# Patient Record
Sex: Female | Born: 1990 | Race: Black or African American | Hispanic: No | Marital: Single | Smoking: Former smoker
Health system: Southern US, Community
[De-identification: ages and names within clinical notes are randomized; demographics above are authoritative.]

---
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
## 2013-11-23 ENCOUNTER — Encounter (HOSPITAL_COMMUNITY): Payer: Self-pay | Admitting: Emergency Medicine

## 2013-11-23 ENCOUNTER — Emergency Department (HOSPITAL_COMMUNITY)
Admission: EM | Admit: 2013-11-23 | Discharge: 2013-11-23 | Disposition: A | Discharge status: Home / Self Care | Payer: Medicaid Other | Attending: Emergency Medicine | Admitting: Emergency Medicine

## 2013-11-23 DIAGNOSIS — R112 Nausea with vomiting, unspecified: Secondary | ICD-10-CM | POA: Insufficient documentation

## 2013-11-23 DIAGNOSIS — Z87891 Personal history of nicotine dependence: Secondary | ICD-10-CM | POA: Insufficient documentation

## 2013-11-23 DIAGNOSIS — R197 Diarrhea, unspecified: Secondary | ICD-10-CM | POA: Insufficient documentation

## 2013-11-23 DIAGNOSIS — Z3202 Encounter for pregnancy test, result negative: Secondary | ICD-10-CM | POA: Insufficient documentation

## 2013-11-23 LAB — CBC WITH DIFFERENTIAL/PLATELET
Basophils Absolute: 0 10*3/uL (ref 0.0–0.1)
Basophils Relative: 0 % (ref 0–1)
Eosinophils Absolute: 0.1 10*3/uL (ref 0.0–0.7)
Eosinophils Relative: 1 % (ref 0–5)
Lymphs Abs: 1.8 10*3/uL (ref 0.7–4.0)
MCH: 30.9 pg (ref 26.0–34.0)
MCHC: 34.6 g/dL (ref 30.0–36.0)
MCV: 89.4 fL (ref 78.0–100.0)
Neutrophils Relative %: 53 % (ref 43–77)
Platelets: 157 10*3/uL (ref 150–400)
RDW: 12.4 % (ref 11.5–15.5)

## 2013-11-23 LAB — URINE MICROSCOPIC-ADD ON

## 2013-11-23 LAB — COMPREHENSIVE METABOLIC PANEL
ALT: 11 U/L (ref 0–35)
Albumin: 4 g/dL (ref 3.5–5.2)
Alkaline Phosphatase: 57 U/L (ref 39–117)
BUN: 16 mg/dL (ref 6–23)
Calcium: 9 mg/dL (ref 8.4–10.5)
Creatinine, Ser: 0.82 mg/dL (ref 0.50–1.10)
GFR calc Af Amer: >90 mL/min (ref >90)
Glucose, Bld: 92 mg/dL (ref 70–99)
Potassium: 3.7 mEq/L (ref 3.5–5.1)
Sodium: 139 mEq/L (ref 135–145)
Total Protein: 7.3 g/dL (ref 6.0–8.3)

## 2013-11-23 LAB — URINALYSIS, ROUTINE W REFLEX MICROSCOPIC
Leukocytes, UA: NEGATIVE
Nitrite: NEGATIVE
Protein, ur: 30 mg/dL — AB
Specific Gravity, Urine: 1.041 — ABNORMAL HIGH (ref 1.005–1.030)
Urobilinogen, UA: 1 mg/dL (ref 0.0–1.0)
pH: 6.5 (ref 5.0–8.0)

## 2013-11-23 LAB — POCT PREGNANCY, URINE: Preg Test, Ur: NEGATIVE

## 2013-11-23 LAB — LIPASE, BLOOD: Lipase: 23 U/L (ref 11–59)

## 2013-11-23 MED ORDER — ONDANSETRON HCL 4 MG PO TABS: 4.0000 mg | ORAL_TABLET | Freq: Four times a day (QID) | ORAL | Status: DC

## 2013-11-23 MED ORDER — ONDANSETRON 4 MG PO TBDP
4.0000 mg | ORAL_TABLET | Freq: Once | ORAL | Status: AC
  Administered 2013-11-23: 4 mg via ORAL
  Filled 2013-11-23: qty 1

## 2013-11-23 NOTE — ED Notes (Signed)
Pt presents with vomiting and diarrhea for the past 2 days- denies any pain anywhere, pt has no complaints at this time.  Pt drank gingerale in the lobby and does not feel nauseas at this time.

## 2013-11-23 NOTE — ED Notes (Signed)
Pt c/o N/V/D after eating only x 2 days; pt denies pain; pt sts LMP was 11/05/13

## 2013-11-23 NOTE — ED Provider Notes (Signed)
CSN: [id number]     Arrival date & time 11/23/13  1640 History   First MD Initiated Contact with Patient 11/23/13 1819     Chief Complaint  Patient presents with  . Emesis  . Diarrhea   (Consider location/radiation/quality/duration/timing/severity/associated sxs/prior Treatment) Patient is a 22 y.o. female presenting with vomiting and diarrhea.  Emesis Severity:  Moderate Duration:  2 days Timing:  Constant Quality:  Stomach contents Able to tolerate:  Liquids Progression:  Improving Chronicity:  New Relieved by:  Nothing Worsened by:  Nothing tried Associated symptoms: diarrhea   Associated symptoms: no abdominal pain and no fever   Diarrhea:    Quality:  Watery   Number of occurrences:  3 today Diarrhea Associated symptoms: vomiting   Associated symptoms: no abdominal pain and no fever     History reviewed. No pertinent past medical history. History reviewed. No pertinent past surgical history. History reviewed. No pertinent family history. History  Substance Use Topics  . Smoking status: Former Games developer  . Smokeless tobacco: Not on file  . Alcohol Use: No   OB History   Grav Para Term Preterm Abortions TAB SAB Ect Mult Living                 Review of Systems  Constitutional: Negative for fever.  HENT: Negative for congestion.   Respiratory: Negative for cough and shortness of breath.   Cardiovascular: Negative for chest pain.  Gastrointestinal: Positive for vomiting and diarrhea. Negative for nausea and abdominal pain.  All other systems reviewed and are negative.    Allergies  Review of patient's allergies indicates no known allergies.  Home Medications  No current outpatient prescriptions on file. BP 101/69  Pulse 85  Temp(Src) 98.3 F (36.8 C) (Oral)  Resp 18  Wt 114 lb 7 oz (51.909 kg)  SpO2 100% Physical Exam  Nursing note and vitals reviewed. Constitutional: She is oriented to person, place, and time. She appears well-developed and  well-nourished. No distress.  HENT:  Head: Normocephalic and atraumatic.  Mouth/Throat: Oropharynx is clear and moist.  Eyes: Conjunctivae are normal. Pupils are equal, round, and reactive to light. No scleral icterus.  Neck: Neck supple.  Cardiovascular: Normal rate, regular rhythm, normal heart sounds and intact distal pulses.   No murmur heard. Pulmonary/Chest: Effort normal and breath sounds normal. No stridor. No respiratory distress. She has no rales.  Abdominal: Soft. Bowel sounds are normal. She exhibits no distension. There is no tenderness.  Musculoskeletal: Normal range of motion.  Neurological: She is alert and oriented to person, place, and time.  Skin: Skin is warm and dry. No rash noted.  Psychiatric: She has a normal mood and affect. Her behavior is normal.    ED Course  Procedures (including critical care time) Labs Review Labs Reviewed  URINALYSIS, ROUTINE W REFLEX MICROSCOPIC - Abnormal; Notable for the following:    APPearance CLOUDY (*)    Specific Gravity, Urine 1.041 (*)    Bilirubin Urine SMALL (*)    Ketones, ur 15 (*)    Protein, ur 30 (*)    All other components within normal limits  URINE MICROSCOPIC-ADD ON - Abnormal; Notable for the following:    Squamous Epithelial / LPF MANY (*)    Bacteria, UA FEW (*)    All other components within normal limits  CBC WITH DIFFERENTIAL  COMPREHENSIVE METABOLIC PANEL  LIPASE, BLOOD  POCT PREGNANCY, URINE   Imaging Review No results found.  EKG Interpretation   None  MDM   1. Nausea vomiting and diarrhea    Well appearing 22 yo female with vomiting yesterday and diarrhea and persistent nausea today.  She is nontoxic, not distressed, does not appear dehydrated.  Abdominal exam completely benign.  Will try zofran.  Advised plenty of fluids (which she is tolerating).      Candyce Churn, MD 11/23/13 [phone]

## 2014-08-26 ENCOUNTER — Emergency Department (HOSPITAL_COMMUNITY)
Admission: EM | Admit: 2014-08-26 | Discharge: 2014-08-26 | Disposition: A | Discharge status: Home / Self Care | Payer: Medicaid Other | Attending: Emergency Medicine | Admitting: Emergency Medicine

## 2014-08-26 DIAGNOSIS — R404 Transient alteration of awareness: Secondary | ICD-10-CM | POA: Diagnosis present

## 2014-08-26 DIAGNOSIS — R29898 Other symptoms and signs involving the musculoskeletal system: Secondary | ICD-10-CM | POA: Insufficient documentation

## 2014-08-26 DIAGNOSIS — Z79899 Other long term (current) drug therapy: Secondary | ICD-10-CM | POA: Insufficient documentation

## 2014-08-26 DIAGNOSIS — Z3202 Encounter for pregnancy test, result negative: Secondary | ICD-10-CM | POA: Diagnosis not present

## 2014-08-26 DIAGNOSIS — Z87891 Personal history of nicotine dependence: Secondary | ICD-10-CM | POA: Insufficient documentation

## 2014-08-26 DIAGNOSIS — F410 Panic disorder [episodic paroxysmal anxiety] without agoraphobia: Secondary | ICD-10-CM | POA: Diagnosis not present

## 2014-08-26 LAB — I-STAT CHEM 8, ED
BUN: 13 mg/dL (ref 6–23)
CHLORIDE: 105 meq/L (ref 96–112)
Calcium, Ion: 1.23 mmol/L (ref 1.12–1.23)
Creatinine, Ser: 0.9 mg/dL (ref 0.50–1.10)
Glucose, Bld: 75 mg/dL (ref 70–99)
HCT: 40 % (ref 36.0–46.0)
Hemoglobin: 13.6 g/dL (ref 12.0–15.0)
POTASSIUM: 3.5 meq/L — AB (ref 3.7–5.3)
SODIUM: 142 meq/L (ref 137–147)
TCO2: 23 mmol/L (ref 0–100)

## 2014-08-26 LAB — POC URINE PREG, ED: Preg Test, Ur: NEGATIVE

## 2014-08-26 MED ORDER — DIAZEPAM 5 MG PO TABS
5.0000 mg | ORAL_TABLET | Freq: Once | ORAL | Status: AC
  Administered 2014-08-26: 5 mg via ORAL
  Filled 2014-08-26: qty 1

## 2014-08-26 NOTE — Discharge Instructions (Signed)

## 2014-08-26 NOTE — ED Notes (Signed)
Per EMS: Pt was reported to have hyperventilated until she had a syncopal episode. Pt states the panic attack began at 1530 and had numbness and cramping of her hands and feet. Pt reports that this started because she broke up with her boyfriend. Pt states this happened one month ago, however has not seen a doctor for that problem. Pt states that this episode was worse than last month.

## 2014-08-26 NOTE — Progress Notes (Signed)
  CARE MANAGEMENT ED NOTE 08/26/2014  Patient:  Vanessa Mann, Vanessa Mann   Account Number:  [id number]  Date Initiated:  08/26/2014  Documentation initiated by:  Radford Pax  Subjective/Objective Assessment:   Patient presents to Ed wth panic attack     Subjective/Objective Assessment Detail:     Action/Plan:   Action/Plan Detail:   Anticipated DC Date:  08/26/2014     Status Recommendation to Physician:   Result of Recommendation:    Other ED Services  Consult Working Plan    DC Planning Services  Other  PCP issues    Choice offered to / List presented to:            Status of service:  Completed, signed off  ED Comments:   ED Comments Detail:  EDCM spoke to patient at bedside.  Patient reports she does not have a pcp.  [facility] asked patient if there was a doctor printed on her Medicaid card?  Patient presented [facility] with Medicaid card.  Pcp listed on patient's Medicaid card is [facility].  [facility] informed patient that this was her pcp and encouraged patient to make a follow up appointment.  Patient verbalized understanding. System updated.

## 2014-08-26 NOTE — ED Provider Notes (Signed)
CSN: [id number]     Arrival date & time 08/26/14  1700 History   First MD Initiated Contact with Patient 08/26/14 1744     Chief Complaint  Patient presents with  . Anxiety  . Loss of Consciousness     (Consider location/radiation/quality/duration/timing/severity/associated sxs/prior Treatment) Patient is a 23 y.o. female presenting with anxiety and syncope. The history is provided by the patient. No language interpreter was used.  Anxiety This is a recurrent problem. The current episode started today. The problem occurs intermittently. The problem has been resolved. Associated symptoms comments: Hyperventilation, hand cramping. The symptoms are aggravated by stress.  Loss of Consciousness Associated symptoms: anxiety     No past medical history on file. No past surgical history on file. No family history on file. History  Substance Use Topics  . Smoking status: Former Games developer  . Smokeless tobacco: Not on file  . Alcohol Use: No   OB History   Grav Para Term Preterm Abortions TAB SAB Ect Mult Living                 Review of Systems  Cardiovascular: Positive for syncope.  Psychiatric/Behavioral: The patient is nervous/anxious.   All other systems reviewed and are negative.     Allergies  Review of patient's allergies indicates no known allergies.  Home Medications   Prior to Admission medications   Medication Sig Start Date End Date Taking? Authorizing Provider  fexofenadine (ALLEGRA) 180 MG tablet Take 180 mg by mouth daily.   Yes Historical Provider, MD  Prenatal Vit-Fe Fumarate-FA (PRENATAL MULTIVITAMIN) TABS tablet Take 1 tablet by mouth daily at 12 noon.   Yes Historical Provider, MD   BP 140/72  Pulse 76  Temp(Src) 98.2 F (36.8 C) (Oral)  Resp 18  SpO2 99% Physical Exam  Nursing note and vitals reviewed. Constitutional: She is oriented to person, place, and time. She appears well-developed and well-nourished. No distress.  HENT:  Head: Normocephalic.   Eyes: Pupils are equal, round, and reactive to light.  Neck: Neck supple.  Cardiovascular: Normal rate and regular rhythm.   Pulmonary/Chest: Effort normal and breath sounds normal.  Abdominal: Soft. Bowel sounds are normal.  Musculoskeletal: Normal range of motion. She exhibits no edema and no tenderness.  Hand cramping.  Lymphadenopathy:    She has no cervical adenopathy.  Neurological: She is alert and oriented to person, place, and time.  Skin: Skin is warm and dry.  Psychiatric: She has a normal mood and affect.    ED Course  Procedures (including critical care time) Labs Review Labs Reviewed  I-STAT CHEM 8, ED - Abnormal; Notable for the following:    Potassium 3.5 (*)    All other components within normal limits  POC URINE PREG, ED    Imaging Review No results found.   EKG Interpretation None     Patient with episode of hyperventilation followed by a brief syncopal episode.  Symptoms have mostly resolved with the exception of mild right hand cramping.  Not dyspneic or tachycardic. No suspicion for PE.  Labs reviewed, results shared with patient.  Hand cramping resolved during stay in ED. MDM   Final diagnoses:  None    Anxiety. Hyperventilation.    Jimmye Norman, NP 08/26/14 [phone]
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
## 2014-08-26 NOTE — ED Notes (Signed)
Pt requesting muscle relaxer for generalized muscle "tightness." NP aware.

## 2014-08-27 NOTE — ED Provider Notes (Signed)
Medical screening examination/treatment/procedure(s) were performed by non-physician practitioner and as supervising physician I was immediately available for consultation/collaboration.   EKG Interpretation None       Rusty Glodowski, MD 08/27/14 1537 

## 2015-01-25 ENCOUNTER — Emergency Department (HOSPITAL_COMMUNITY): Payer: Medicaid Other

## 2015-01-25 ENCOUNTER — Emergency Department (HOSPITAL_COMMUNITY)
Admission: EM | Admit: 2015-01-25 | Discharge: 2015-01-25 | Disposition: A | Discharge status: Home / Self Care | Payer: Medicaid Other | Attending: Emergency Medicine | Admitting: Emergency Medicine

## 2015-01-25 ENCOUNTER — Encounter (HOSPITAL_COMMUNITY): Payer: Self-pay | Admitting: *Deleted

## 2015-01-25 DIAGNOSIS — Y9389 Activity, other specified: Secondary | ICD-10-CM | POA: Diagnosis not present

## 2015-01-25 DIAGNOSIS — Z3202 Encounter for pregnancy test, result negative: Secondary | ICD-10-CM | POA: Diagnosis not present

## 2015-01-25 DIAGNOSIS — Y998 Other external cause status: Secondary | ICD-10-CM | POA: Insufficient documentation

## 2015-01-25 DIAGNOSIS — Z79899 Other long term (current) drug therapy: Secondary | ICD-10-CM | POA: Diagnosis not present

## 2015-01-25 DIAGNOSIS — S060X9A Concussion with loss of consciousness of unspecified duration, initial encounter: Secondary | ICD-10-CM

## 2015-01-25 DIAGNOSIS — S199XXA Unspecified injury of neck, initial encounter: Secondary | ICD-10-CM | POA: Diagnosis not present

## 2015-01-25 DIAGNOSIS — Z87891 Personal history of nicotine dependence: Secondary | ICD-10-CM | POA: Diagnosis not present

## 2015-01-25 DIAGNOSIS — S3991XA Unspecified injury of abdomen, initial encounter: Secondary | ICD-10-CM | POA: Insufficient documentation

## 2015-01-25 DIAGNOSIS — S299XXA Unspecified injury of thorax, initial encounter: Secondary | ICD-10-CM | POA: Insufficient documentation

## 2015-01-25 DIAGNOSIS — S0990XA Unspecified injury of head, initial encounter: Secondary | ICD-10-CM | POA: Diagnosis present

## 2015-01-25 DIAGNOSIS — Y92410 Unspecified street and highway as the place of occurrence of the external cause: Secondary | ICD-10-CM | POA: Diagnosis not present

## 2015-01-25 DIAGNOSIS — S060X1A Concussion with loss of consciousness of 30 minutes or less, initial encounter: Secondary | ICD-10-CM

## 2015-01-25 LAB — BASIC METABOLIC PANEL
Anion gap: 6 (ref 5–15)
BUN: 12 mg/dL (ref 6–23)
CALCIUM: 9.3 mg/dL (ref 8.4–10.5)
CO2: 25 mmol/L (ref 19–32)
Chloride: 108 mmol/L (ref 96–112)
Creatinine, Ser: 0.87 mg/dL (ref 0.50–1.10)
Glucose, Bld: 86 mg/dL (ref 70–99)
Potassium: 3.9 mmol/L (ref 3.5–5.1)
Sodium: 139 mmol/L (ref 135–145)

## 2015-01-25 LAB — CBC WITH DIFFERENTIAL/PLATELET
Basophils Absolute: 0 10*3/uL (ref 0.0–0.1)
Basophils Relative: 0 % (ref 0–1)
Eosinophils Absolute: 0.1 10*3/uL (ref 0.0–0.7)
Eosinophils Relative: 3 % (ref 0–5)
HCT: 36.2 % (ref 36.0–46.0)
Hemoglobin: 12.1 g/dL (ref 12.0–15.0)
LYMPHS ABS: 1.7 10*3/uL (ref 0.7–4.0)
Lymphocytes Relative: 33 % (ref 12–46)
MCH: 29.4 pg (ref 26.0–34.0)
MCHC: 33.4 g/dL (ref 30.0–36.0)
MCV: 87.9 fL (ref 78.0–100.0)
MONOS PCT: 9 % (ref 3–12)
Monocytes Absolute: 0.5 10*3/uL (ref 0.1–1.0)
NEUTROS ABS: 2.8 10*3/uL (ref 1.7–7.7)
Neutrophils Relative %: 55 % (ref 43–77)
Platelets: 187 10*3/uL (ref 150–400)
RBC: 4.12 MIL/uL (ref 3.87–5.11)
RDW: 12.2 % (ref 11.5–15.5)
WBC: 5.1 10*3/uL (ref 4.0–10.5)

## 2015-01-25 LAB — I-STAT BETA HCG BLOOD, ED (MC, WL, AP ONLY): I-stat hCG, quantitative: <5 m[IU]/mL (ref <5)

## 2015-01-25 LAB — I-STAT CHEM 8, ED
BUN: 13 mg/dL (ref 6–23)
CALCIUM ION: 1.26 mmol/L — AB (ref 1.12–1.23)
CHLORIDE: 106 mmol/L (ref 96–112)
Creatinine, Ser: 0.9 mg/dL (ref 0.50–1.10)
Glucose, Bld: 82 mg/dL (ref 70–99)
HCT: 42 % (ref 36.0–46.0)
Hemoglobin: 14.3 g/dL (ref 12.0–15.0)
POTASSIUM: 3.9 mmol/L (ref 3.5–5.1)
Sodium: 141 mmol/L (ref 135–145)
TCO2: 21 mmol/L (ref 0–100)

## 2015-01-25 MED ORDER — IOHEXOL 300 MG/ML  SOLN
100.0000 mL | Freq: Once | INTRAMUSCULAR | Status: AC | PRN
  Administered 2015-01-25: 70 mL via INTRAVENOUS

## 2015-01-25 MED ORDER — KETOROLAC TROMETHAMINE 30 MG/ML IJ SOLN
30.0000 mg | Freq: Once | INTRAMUSCULAR | Status: AC
  Administered 2015-01-25: 30 mg via INTRAVENOUS
  Filled 2015-01-25: qty 1

## 2015-01-25 MED ORDER — NAPROXEN 500 MG PO TABS: 500.0000 mg | ORAL_TABLET | Freq: Two times a day (BID) | ORAL | Status: DC

## 2015-01-25 NOTE — ED Notes (Signed)
Pt is in stable condition upon d/c and is escorted by ED staff via wheelchair from ED.
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
## 2015-01-25 NOTE — ED Notes (Signed)
Pt returned from radiology.
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
## 2016-06-18 ENCOUNTER — Encounter (HOSPITAL_COMMUNITY): Payer: Self-pay | Admitting: Emergency Medicine

## 2016-06-18 ENCOUNTER — Emergency Department (HOSPITAL_COMMUNITY)
Admission: EM | Admit: 2016-06-18 | Discharge: 2016-06-18 | Disposition: A | Discharge status: Home / Self Care | Payer: No Typology Code available for payment source | Attending: Emergency Medicine | Admitting: Emergency Medicine

## 2016-06-18 DIAGNOSIS — K0889 Other specified disorders of teeth and supporting structures: Secondary | ICD-10-CM | POA: Insufficient documentation

## 2016-06-18 DIAGNOSIS — Z792 Long term (current) use of antibiotics: Secondary | ICD-10-CM | POA: Insufficient documentation

## 2016-06-18 DIAGNOSIS — F410 Panic disorder [episodic paroxysmal anxiety] without agoraphobia: Secondary | ICD-10-CM | POA: Insufficient documentation

## 2016-06-18 DIAGNOSIS — Z87891 Personal history of nicotine dependence: Secondary | ICD-10-CM | POA: Insufficient documentation

## 2016-06-18 DIAGNOSIS — Z791 Long term (current) use of non-steroidal anti-inflammatories (NSAID): Secondary | ICD-10-CM | POA: Insufficient documentation

## 2016-06-18 MED ORDER — PENICILLIN V POTASSIUM 500 MG PO TABS: 500.0000 mg | ORAL_TABLET | Freq: Four times a day (QID) | ORAL | Status: AC

## 2016-06-18 MED ORDER — NAPROXEN 500 MG PO TABS
500.0000 mg | ORAL_TABLET | Freq: Two times a day (BID) | ORAL | Status: AC
Start: 2016-06-18 — End: ?

## 2016-06-18 MED ORDER — ONDANSETRON 4 MG PO TBDP
4.0000 mg | ORAL_TABLET | Freq: Once | ORAL | Status: AC
  Administered 2016-06-18: 4 mg via ORAL
  Filled 2016-06-18: qty 1

## 2016-06-18 MED ORDER — GI COCKTAIL ~~LOC~~
30.0000 mL | Freq: Once | ORAL | Status: AC
Start: 2016-06-18 — End: 2016-06-18
  Administered 2016-06-18: 30 mL via ORAL
  Filled 2016-06-18: qty 30

## 2016-06-18 NOTE — ED Notes (Signed)
"  My toothache is triggering my panic attack". C/o right dental pain, onset yesterday. Pt appears very anxious.

## 2016-06-18 NOTE — ED Provider Notes (Signed)
CSN: [id number]     Arrival date & time 06/18/16  0909 History  By signing my name below, I, Vanessa Mann, attest that this documentation has been prepared under the direction and in the presence of non-physician practitioner, Everlene Farrier, PA-C. Electronically Signed: Freida Mann, Scribe. 06/18/2016. 9:22 AM.    Chief Complaint  Patient presents with  . Dental Pain   The history is provided by the patient. No language interpreter was used.    HPI Comments:  Vanessa Mann is a 25 y.o. female who presents to the Emergency Department complaining of 10/10, constant, upper right dental, pain since yesterday afternoon. Pt states she tried to get in with her dentist but they were booked. She has been taking ibuprofen with little relief. Pt reports associated nausea. She denies fever, sore throat, trouble swallowing, discharge with from the tooth or from her ears, ear pain, neck pain, vomiting, and cough.    History reviewed. No pertinent past medical history. Past Surgical History  Procedure Laterality Date  . Cesarean section     No family history on file., Social History  Substance Use Topics  . Smoking status: Former Games developer  . Smokeless tobacco: None  . Alcohol Use: No   OB History    No data available     Review of Systems  Constitutional: Negative for fever.  HENT: Positive for dental problem. Negative for ear discharge, ear pain, sore throat and trouble swallowing.   Eyes: Negative for visual disturbance.  Respiratory: Negative for cough and shortness of breath.   Gastrointestinal: Positive for nausea. Negative for vomiting, abdominal pain and diarrhea.  Musculoskeletal: Negative for neck pain and neck stiffness.  Skin: Negative for rash.  All other systems reviewed and are negative.   Allergies  Review of patient's allergies indicates no known allergies.  Home Medications   Prior to Admission medications   Medication Sig Start Date End Date Taking?  Authorizing Provider  etonogestrel (NEXPLANON) 68 MG IMPL implant 1 each by Subdermal route once.    Historical Provider, MD  fexofenadine (ALLEGRA) 180 MG tablet Take 180 mg by mouth daily.    Historical Provider, MD  naproxen (NAPROSYN) 500 MG tablet Take 1 tablet (500 mg total) by mouth 2 (two) times daily with a meal. 06/18/16   Everlene Farrier, PA-C  penicillin v potassium (VEETID) 500 MG tablet Take 1 tablet (500 mg total) by mouth 4 (four) times daily. 06/18/16   Everlene Farrier, PA-C  Pseudoephedrine-APAP-DM (DAYQUIL PO) Take 1 Dose by mouth as needed (cold and cough symptoms).    Historical Provider, MD   BP 105/70 mmHg  Pulse 90  Temp(Src) 98.5 F (36.9 C) (Oral)  Resp 22  SpO2 99%  LMP 06/11/2016 Physical Exam  Constitutional: She appears well-developed and well-nourished. No distress.  Non-toxic appearing.   HENT:  Head: Normocephalic and atraumatic.  Right Ear: External ear normal.  Left Ear: External ear normal.  Mouth/Throat: Oropharynx is clear and moist. No oropharyngeal exudate.  Tenderness to right tooth #5.  No discharge from the mouth. No facial swelling.  Uvula is midline without edema. Soft palate rises symmetrically. No tonsillar hypertrophy or exudates. Tongue protrusion is normal.  No trismus.  Bilateral TMs pearly gray without ery of loss of landmarks   Eyes: Conjunctivae are normal. Pupils are equal, round, and reactive to light. Right eye exhibits no discharge. Left eye exhibits no discharge.  Neck: Normal range of motion. Neck supple. No JVD present. No tracheal deviation present.  Cardiovascular: Normal rate, regular rhythm, normal heart sounds and intact distal pulses.   Pulmonary/Chest: Effort normal and breath sounds normal. No stridor. No respiratory distress. She has no wheezes. She has no rales.  Abdominal: Soft. There is no tenderness.  Lymphadenopathy:    She has no cervical adenopathy.  Neurological: Coordination normal.  Skin: Skin is warm and  dry. No rash noted. She is not diaphoretic. No erythema. No pallor.  Psychiatric: Her behavior is normal. Her mood appears anxious.  Patient appears anxious.  Nursing note and vitals reviewed.   ED Course  Procedures   DIAGNOSTIC STUDIES:  Oxygen Saturation is 99% on RA, normal by my interpretation.    COORDINATION OF CARE:  9:19 AM Will discharge with antibiotic and pain meds. Discussed treatment plan with pt at bedside and pt agreed to plan.   MDM   Meds given in ED:  Medications - No data to display  Discharge Medication List as of 06/18/2016  9:22 AM    START taking these medications   Details  penicillin v potassium (VEETID) 500 MG tablet Take 1 tablet (500 mg total) by mouth 4 (four) times daily., Starting 06/18/2016, Until Discontinued, Print        Final diagnoses:  Pain, dental   Patient with dentalgia.  No abscess requiring immediate incision and drainage.  Exam not concerning for Ludwig's angina or pharyngeal abscess.  Will treat with penicillin. Pt instructed to follow-up with dentist Dr. Excell Seltzerooper. Dental resource guide also provided.  Discussed return precautions. Pt safe for discharge. I advised the patient to follow-up with their primary care provider this week. I advised the patient to return to the emergency department with new or worsening symptoms or new concerns. The patient verbalized understanding and agreement with plan.       Everlene FarrierWilliam Elaijah Munoz, PA-C 06/18/16 [id number]  Donnetta HutchingBrian Cook, MD 06/20/16 [id number]
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
## 2016-06-18 NOTE — Discharge Instructions (Signed)
Dental Pain °Dental pain may be caused by many things, including: °· Tooth decay (cavities or caries). Cavities expose the nerve of your tooth to air and hot or cold temperatures. This can cause pain or discomfort. °· Abscess or infection. A dental abscess is a collection of infected pus from a bacterial infection in the inner part of the tooth (pulp). It usually occurs at the end of the tooth's root. °· Injury. °· An unknown reason (idiopathic). °Your pain may be mild or severe. It may only occur when: °· You are chewing. °· You are exposed to hot or cold temperature. °· You are eating or drinking sugary foods or beverages, such as soda or candy. °Your pain may also be constant. °HOME CARE INSTRUCTIONS °Watch your dental pain for any changes. The following actions may help to lessen any discomfort that you are feeling: °· Take medicines only as directed by your dentist. °· If you were prescribed an antibiotic medicine, finish all of it even if you start to feel better. °· Keep all follow-up visits as directed by your dentist. This is important. °· Do not apply heat to the outside of your face. °· Rinse your mouth or gargle with salt water if directed by your dentist. This helps with pain and swelling. °¨ You can make salt water by adding ¼ tsp of salt to 1 cup of warm water. °· Apply ice to the painful area of your face: °¨ Put ice in a plastic bag. °¨ Place a towel between your skin and the bag. °¨ Leave the ice on for 20 minutes, 2-3 times per day. °· Avoid foods or drinks that cause you pain, such as: °¨ Very hot or very cold foods or drinks. °¨ Sweet or sugary foods or drinks. °SEEK MEDICAL CARE IF: °· Your pain is not controlled with medicines. °· Your symptoms are worse. °· You have new symptoms. °SEEK IMMEDIATE MEDICAL CARE IF: °· You are unable to open your mouth. °· You are having trouble breathing or swallowing. °· You have a fever. °· Your face, neck, or jaw is swollen. °  °This information is not  intended to replace advice given to you by your health care provider. Make sure you discuss any questions you have with your health care provider. °  °Document Released: 11/18/2005 Document Revised: 04/04/2015 Document Reviewed: 11/14/2014 °Elsevier Interactive Patient Education ©2016 Elsevier Inc. ° °Community Resource Guide Dental °The [employer]’s “211” is a great source of information about community services available.  Access by dialing 2-1-1 from anywhere in [place], or by website -  www.nc211.org.  ° °Other Local Resources (Updated 12/2015) ° °Dental  Care °  °Services ° °  °Phone Number and Address  °Cost  °[facility] For children 0 - 21 years of age:  °• Cleaning °• Tooth brushing/flossing instruction °• Sealants, fillings, crowns °• Extractions °• Emergency treatment  [phone] °[address] °[place], [place] [address] Charges based on family income.  Medicaid and some insurance plans accepted.   °  °[facility] [place] • Cleaning °• Sealants, fillings, crowns °• Extractions °• Emergency treatment [phone] °[address] °, [place] ° Pregnant women 18 years of age or older with a Medicaid card  °[facility] [place] • Cleaning °• Sealants, fillings, crowns °• Extractions °• Emergency treatment [phone] °[address] °[place], [place] Pregnant women 18 years of age or older with a   Medicaid card  °[facility] For children 0 - 21 years of age:  °• Cleaning °• Tooth brushing/flossing instruction °• Sealants, fillings, crowns °• Extractions °• Emergency treatment °Limited orthodontic services for patients with Medicaid [phone] °[address] °[place], [place] [address] Medicaid and [place] Health Choice cover for children up to age 21 and pregnant women.  Parents of children up to age 21 without Medicaid pay a  reduced fee at time of service.  °[facility] [place] For children 0 - 21 years of age:  °• Cleaning °• Tooth brushing/flossing instruction °• Sealants, fillings, crowns °• Extractions °• Emergency treatment °Limited orthodontic services for patients with Medicaid [phone] °[address] °[place], [place].  Medicaid and [place] Health Choice cover for children up to age 21 and pregnant women.  Parents of children up to age 21 without Medicaid pay a reduced fee.  °[facility] • Cleaning °• Sealants, fillings, crowns °• Extractions ° °Hours: Tuesdays and Thursdays, 4:15 - 8 pm [phone] °[address] °[place], [place] [address] Services free of charge to [place] residents ages 18-64 who do not have health insurance, Medicare, Medicaid, or VA benefits and fall within federal poverty guidelines  °[facility] ° ° ° Provides dental care in addition to primary medical care, nutritional counseling, and pharmacy: °• Cleaning °• Sealants, fillings, crowns °• Extractions ° ° ° ° ° ° ° ° ° ° ° ° ° ° ° ° ° [phone] °[facility], [address] °[place], [place] ° °[phone] °[facility], [address] [place], [place] ° °[phone] °[facility] °[place], [place] ° °[phone] °[facility], [address] °[place], [place] ° °[phone] °[facility] °[address] °[place], [place] Accepts Medicaid, Medicare, most insurance.  Also provides services available to all with fees adjusted based on ability to pay.    °[facility] • Cleaning °• Tooth brushing/flossing instruction °• Sealants, fillings, crowns °• Extractions °• Emergency treatment °Hours: Tuesdays, Thursdays, and Fridays from 8 am to 5 pm by appointment only. [phone] °[address] °[place], [place]  [address] [place] residents with Medicaid (depending on eligibility) and children with [place] Health Choice - call for more information.  °[facility] • Extractions only ° °Hours: 2nd and 4th Thursday of each month from 6:30 am - 9 am.   [phone] ext. [phone] °[address] °[place], [place] [address] Ages 18 and older only.  Patients are seen on a first come, first served basis.  °[facility] • Cleanings °• Fillings °• Extractions °• Orthodontics °• Endodontics °• Implants/Crowns/Bridges °• Complete and partial dentures [phone] °[place],  Patients must complete an application for services.  There is often a waiting list.   ° °

## 2016-06-18 NOTE — ED Provider Notes (Signed)
CSN: [id number]     Arrival date & time 06/18/16  1855 History   First MD Initiated Contact with Patient 06/18/16 1926     Chief Complaint  Patient presents with  . Panic Attack  . Dental Pain     (Consider location/radiation/quality/duration/timing/severity/associated sxs/prior Treatment) HPI 25 year old Female who presents with panic attack in the setting of dental pain. She was seen this morning at 9 AM at [facility] ED for right upper dental pain ongoing for the past day. She was given dental resources, started on penicillin and told to use over-the-counter medications for pain control. States that this afternoon she took a tablet of Naprosyn and 2 tablets of ibuprofen for pain, and subsequently felt epigastric discomfort and nausea. She was concerned about potential overdose and states that she had a panic attack subsequently. On arrival to the ED she states that she now feels better and that her panic attack is now resolved. She has not had any oropharyngeal swelling, neck pain, fever or chills.   No past medical history on file. Past Surgical History  Procedure Laterality Date  . Cesarean section     Family History  Problem Relation Age of Onset  . Hypertension Mother    Social History  Substance Use Topics  . Smoking status: Former Games developer  . Smokeless tobacco: None  . Alcohol Use: No   OB History    No data available     Review of Systems 10/14 systems reviewed and are negative other than those stated in the HPI    Allergies  Review of patient's allergies indicates no known allergies.  Home Medications   Prior to Admission medications   Medication Sig Start Date End Date Taking? Authorizing Provider  ibuprofen (ADVIL,MOTRIN) 800 MG tablet Take 1,600 mg by mouth every 8 (eight) hours as needed for moderate pain.   Yes Historical Provider, MD  naproxen (NAPROSYN) 500 MG tablet Take 1 tablet (500 mg total) by mouth 2 (two) times daily with a meal. 06/18/16  Yes  Everlene Farrier, PA-C  penicillin v potassium (VEETID) 500 MG tablet Take 1 tablet (500 mg total) by mouth 4 (four) times daily. 06/18/16  Yes Everlene Farrier, PA-C  etonogestrel (NEXPLANON) 68 MG IMPL implant 1 each by Subdermal route once.    Historical Provider, MD  Pseudoephedrine-APAP-DM (DAYQUIL PO) Take 1 Dose by mouth as needed (cold and cough symptoms).    Historical Provider, MD   BP 104/75 mmHg  Pulse 69  Temp(Src) 98.2 F (36.8 C) (Oral)  Resp 16  SpO2 100%  LMP 06/11/2016 Physical Exam Physical Exam  Nursing note and vitals reviewed. Constitutional: Well developed, well nourished, non-toxic, and in no acute distress Head: Normocephalic and atraumatic.  Mouth/Throat: Oropharynx is clear and moist. Pain to percussion of tooth number 3 w/o swelling or abscess Neck: Normal range of motion. Neck supple.  Cardiovascular: Normal rate and regular rhythm.   Pulmonary/Chest: Effort normal and breath sounds normal.  Abdominal: Soft. There is mild epigastric tenderness. There is no rebound and no guarding.  Musculoskeletal: Normal range of motion.  Neurological: Alert, no facial droop, fluent speech, moves all extremities symmetrically Skin: Skin is warm and dry.  Psychiatric: Cooperative  ED Course  Procedures (including critical care time) Labs Review Labs Reviewed - No data to display  Imaging Review No results found. I have personally reviewed and evaluated these images and lab results as part of my medical decision-making.   EKG Interpretation None  MDM   Final diagnoses:  Pain, dental  Panic attack   25 year old female who presents with panic attack, now resolved. Typical of her normal panic attacks she states. She currently is well-appearing in no acute distress with normal vital signs. Abdomen overall benign with some mild epigastric discomfort likely from NSAID usage recently. Symptoms improved with Zofran ODT and GI cocktail. She is given low cost dental  resources. Discussed appropriate ways to take over-the-counter medications for pain and she will continue her penicillin that was prescribed this morning. Strict return and follow-up instructions reviewed. She expressed understanding of all discharge instructions and felt comfortable with the plan of care.     Lavera Guiseana Duo Liu, MD 06/18/16 2030

## 2016-06-18 NOTE — Discharge Instructions (Signed)
You are given dental resources. Continue to take either naprosyn OR ibuprofen not both, with tylenol for pain. Take antibiotics as prescribed this morning. Return for worsening symptoms, including fever, facial or throat swelling, or any other symptoms concerning to you.   Panic Attacks Panic attacks are sudden, short feelings of great fear or discomfort. You may have them for no reason when you are relaxed, when you are uneasy (anxious), or when you are sleeping.  HOME CARE  Take all your medicines as told.  Check with your doctor before starting new medicines.  Keep all doctor visits. GET HELP IF:  You are not able to take your medicines as told.  Your symptoms do not get better.  Your symptoms get worse. GET HELP RIGHT AWAY IF:  Your attacks seem different than your normal attacks.  You have thoughts about hurting yourself or others.  You take panic attack medicine and you have a side effect. MAKE SURE YOU:  Understand these instructions.  Will watch your condition.  Will get help right away if you are not doing well or get worse.   This information is not intended to replace advice given to you by your health care provider. Make sure you discuss any questions you have with your health care provider.   Document Released: 12/21/2010 Document Revised: 09/08/2013 Document Reviewed: 07/02/2013 Elsevier Interactive Patient Education [employer]2016 Elsevier Inc.

## 2016-06-18 NOTE — ED Notes (Addendum)
Per EMS p/t found at home called for possible overdose.P/t took Penicillin x2 , Naproxyn x1, and Ibprofen x2. P/t found hyperventilating upon EMS arrival.  Hx of panic attack with no home management meds.Periphereal IV 20G in Left AC. 1x emisis episode in route, 4 mg Zofran administered by EMS. Syncope episode experienced briefly on transport. P/t states she was overwhelmed by pain and caring for young child at home. Pt vitals stable: BP115/76 Pulse 84 SPO2 97  RR 20 CBG 124.
